# Patient Record
Sex: Female | Born: 2000 | Race: White | Hispanic: No | Marital: Single | Smoking: Never smoker
Health system: Southern US, Community
[De-identification: ages and names within clinical notes are randomized; demographics above are authoritative.]

## PROBLEM LIST (undated history)

## (undated) DIAGNOSIS — Q2546 Tortuous aortic arch: Secondary | ICD-10-CM

## (undated) DIAGNOSIS — Q211 Atrial septal defect, unspecified: Secondary | ICD-10-CM

## (undated) DIAGNOSIS — Q210 Ventricular septal defect: Secondary | ICD-10-CM

## (undated) DIAGNOSIS — G43909 Migraine, unspecified, not intractable, without status migrainosus: Secondary | ICD-10-CM

## (undated) DIAGNOSIS — F419 Anxiety disorder, unspecified: Secondary | ICD-10-CM

## (undated) DIAGNOSIS — F909 Attention-deficit hyperactivity disorder, unspecified type: Secondary | ICD-10-CM

## (undated) HISTORY — PX: CARDIAC SURGERY: SHX584

## (undated) HISTORY — DX: Migraine, unspecified, not intractable, without status migrainosus: G43.909

## (undated) HISTORY — PX: CORONARY STENT PLACEMENT: SHX1402

## (undated) HISTORY — DX: Anxiety disorder, unspecified: F41.9

## (undated) HISTORY — DX: Attention-deficit hyperactivity disorder, unspecified type: F90.9

---
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
## 2019-04-10 ENCOUNTER — Ambulatory Visit: Payer: 59 | Attending: Internal Medicine

## 2019-04-10 DIAGNOSIS — Z23 Encounter for immunization: Secondary | ICD-10-CM

## 2019-04-10 NOTE — Progress Notes (Signed)
   Covid-19 Vaccination Clinic  Name:  Savannah Malone    MRN: [id number] DOB: Jul 11, 2000  04/10/2019  [name] was observed post Covid-19 immunization for 15 minutes without incident. She was provided with Vaccine Information Sheet and instruction to access the V-Safe system.   [name] was instructed to call 911 with any severe reactions post vaccine: Marland Kitchen Difficulty breathing  . Swelling of face and throat  . A fast heartbeat  . A bad rash all over body  . Dizziness and weakness   Immunizations Administered    Name Date Dose VIS Date Route   Pfizer COVID-19 Vaccine 04/10/2019  4:18 PM 0.3 mL 12/30/2018 Intramuscular   Manufacturer: [employer], [employer]   Lot: [id number]   NDC: [address]-9

## 2019-05-01 ENCOUNTER — Ambulatory Visit: Payer: 59 | Attending: Internal Medicine

## 2019-05-01 DIAGNOSIS — Z23 Encounter for immunization: Secondary | ICD-10-CM

## 2019-05-01 NOTE — Progress Notes (Signed)
   Covid-19 Vaccination Clinic  Name:  Sharry Beining    MRN: [id number] DOB: December 22, 2000  05/01/2019  Ms. Leavens was observed post Covid-19 immunization for 15 minutes without incident. She was provided with Vaccine Information Sheet and instruction to access the V-Safe system.   Ms. Gadbois was instructed to call 911 with any severe reactions post vaccine: [place] Kitchen Difficulty breathing  . Swelling of face and throat  . A fast heartbeat  . A bad rash all over body  . Dizziness and weakness   Immunizations Administered    Name Date Dose VIS Date Route   Pfizer COVID-19 Vaccine 05/01/2019  3:24 PM 0.3 mL 12/30/2018 Intramuscular   Manufacturer: [employer], [employer]   Lot: [id number]   NDC: [address]-4

## 2020-01-27 ENCOUNTER — Emergency Department: Payer: 59

## 2020-01-27 ENCOUNTER — Emergency Department
Admission: EM | Admit: 2020-01-27 | Discharge: 2020-01-27 | Disposition: A | Discharge status: Home / Self Care | Payer: 59 | Attending: Emergency Medicine | Admitting: Emergency Medicine

## 2020-01-27 ENCOUNTER — Other Ambulatory Visit: Payer: Self-pay

## 2020-01-27 DIAGNOSIS — R569 Unspecified convulsions: Secondary | ICD-10-CM | POA: Diagnosis not present

## 2020-01-27 DIAGNOSIS — F445 Conversion disorder with seizures or convulsions: Secondary | ICD-10-CM

## 2020-01-27 DIAGNOSIS — E860 Dehydration: Secondary | ICD-10-CM

## 2020-01-27 LAB — URINE DRUG SCREEN, QUALITATIVE (ARMC ONLY)
Amphetamines, Ur Screen: POSITIVE — AB
Barbiturates, Ur Screen: NOT DETECTED
Benzodiazepine, Ur Scrn: POSITIVE — AB
Cannabinoid 50 Ng, Ur ~~LOC~~: NOT DETECTED
Cocaine Metabolite,Ur ~~LOC~~: NOT DETECTED
MDMA (Ecstasy)Ur Screen: NOT DETECTED
Methadone Scn, Ur: NOT DETECTED
Opiate, Ur Screen: NOT DETECTED
Phencyclidine (PCP) Ur S: NOT DETECTED
Tricyclic, Ur Screen: NOT DETECTED

## 2020-01-27 LAB — COMPREHENSIVE METABOLIC PANEL
ALT: 15 U/L (ref 0–44)
AST: 28 U/L (ref 15–41)
Albumin: 4.8 g/dL (ref 3.5–5.0)
Alkaline Phosphatase: 50 U/L (ref 38–126)
Anion gap: 11 (ref 5–15)
BUN: 15 mg/dL (ref 6–20)
CO2: 22 mmol/L (ref 22–32)
Calcium: 9.4 mg/dL (ref 8.9–10.3)
Chloride: 104 mmol/L (ref 98–111)
Creatinine, Ser: 0.6 mg/dL (ref 0.44–1.00)
GFR, Estimated: >60 mL/min (ref >60)
Glucose, Bld: 81 mg/dL (ref 70–99)
Potassium: 3.8 mmol/L (ref 3.5–5.1)
Sodium: 137 mmol/L (ref 135–145)
Total Bilirubin: 1.2 mg/dL (ref 0.3–1.2)
Total Protein: 7.8 g/dL (ref 6.5–8.1)

## 2020-01-27 LAB — CBC WITH DIFFERENTIAL/PLATELET
Abs Immature Granulocytes: 0.04 10*3/uL (ref 0.00–0.07)
Basophils Absolute: 0 10*3/uL (ref 0.0–0.1)
Basophils Relative: 0 %
Eosinophils Absolute: 0 10*3/uL (ref 0.0–0.5)
Eosinophils Relative: 0 %
HCT: 40.3 % (ref 36.0–46.0)
Hemoglobin: 13.9 g/dL (ref 12.0–15.0)
Immature Granulocytes: 0 %
Lymphocytes Relative: 15 %
Lymphs Abs: 1.5 10*3/uL (ref 0.7–4.0)
MCH: 31.3 pg (ref 26.0–34.0)
MCHC: 34.5 g/dL (ref 30.0–36.0)
MCV: 90.8 fL (ref 80.0–100.0)
Monocytes Absolute: 0.3 10*3/uL (ref 0.1–1.0)
Monocytes Relative: 3 %
Neutro Abs: 8.4 10*3/uL — ABNORMAL HIGH (ref 1.7–7.7)
Neutrophils Relative %: 82 %
Platelets: 253 10*3/uL (ref 150–400)
RBC: 4.44 MIL/uL (ref 3.87–5.11)
RDW: 11.9 % (ref 11.5–15.5)
WBC: 10.3 10*3/uL (ref 4.0–10.5)
nRBC: 0 % (ref 0.0–0.2)

## 2020-01-27 LAB — URINALYSIS, COMPLETE (UACMP) WITH MICROSCOPIC
Bacteria, UA: NONE SEEN
Bilirubin Urine: NEGATIVE
Glucose, UA: NEGATIVE mg/dL
Ketones, ur: 20 mg/dL — AB
Leukocytes,Ua: NEGATIVE
Nitrite: NEGATIVE
Protein, ur: 30 mg/dL — AB
Specific Gravity, Urine: 1.028 (ref 1.005–1.030)
pH: 5 (ref 5.0–8.0)

## 2020-01-27 LAB — PREGNANCY, URINE: Preg Test, Ur: NEGATIVE

## 2020-01-27 LAB — ETHANOL: Alcohol, Ethyl (B): <10 mg/dL (ref <10)

## 2020-01-27 MED ORDER — LEVETIRACETAM IN NACL 1000 MG/100ML IV SOLN
1000.0000 mg | Freq: Once | INTRAVENOUS | Status: DC
  Filled 2020-01-27: qty 100

## 2020-01-27 MED ORDER — SODIUM CHLORIDE 0.9 % IV BOLUS: 1000.0000 mL | Freq: Once | INTRAVENOUS | Status: DC

## 2020-01-27 NOTE — ED Notes (Signed)
Spoke with dr. Derrill Kay regarding pt's presenting complaint and triage. Order for labs received, no order for imaging received.

## 2020-01-27 NOTE — ED Provider Notes (Signed)
[facility] Emergency Department Provider Note  ____________________________________________  Time seen: Approximately 7:43 PM  I have reviewed the triage vital signs and the nursing notes.   HISTORY  Chief Complaint Seizures    HPI Savannah Malone is a 20 y.o. female who presents the emergency department with a friend with complaint of repetitive seizures today.  Patient does have a history of epilepsy but does not take daily medications for same.  Patient states that this morning she developed seizure-like activity that was witnessed by the friend.  It was described as tonic-clonic activity.  Patient did have a loss of consciousness but no apparent bowel or bladder incontinence.  Patient was very tired, appeared postictal and went back to bed after seizure-like activity.  Patient slept most of the day, woke up was complaining of a severe headache.  Patient had Motrin which did not alleviate symptoms and patient had another seizure.  Patient was brought to the emergency department and has had minor seizure-like episodes here in the ED.  There is been no recent reported trauma to the head or neck.  Patient denied having a headache prior to the onset of her 1st seizure today.  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  She does not take any medicine following the CVA and does not take again any medicine for epilepsy.  According to the friend, the patient is prone to having seizure-like activity with emotional stress and the last week has been exceptionally stressful for the patient.  No reported URI symptoms of fevers or chills, nasal congestion, sore throat.  No neck pain or stiffness is reported.  No chest pain, shortness of breath, abdominal pain.  No urinary symptoms.  No chance of pregnancy.  Patient endorses alcohol use yesterday but no illicit substance use.         Past Medical History:  Diagnosis Date  . ASD (atrial septal defect)   . Tortuous aortic arch   .  VSD (ventricular septal defect) and coarctation of aorta     There are no problems to display for this patient.   Past Surgical History:  Procedure Laterality Date  . CORONARY STENT PLACEMENT     Aortic Arch    Prior to Admission medications   Not on File    Allergies Patient has no known allergies.  No family history on file.  Social History Social History   Tobacco Use  . Smoking status: Never Smoker  . Smokeless tobacco: Never Used  Substance Use Topics  . Drug use: Not Currently     Review of Systems  Constitutional: No fever/chills Eyes: No visual changes. No discharge ENT: No upper respiratory complaints. Cardiovascular: no chest pain. Respiratory: no cough. No SOB. Gastrointestinal: No abdominal pain.  No nausea, no vomiting.  No diarrhea.  No constipation. Genitourinary: Negative for dysuria. No hematuria Musculoskeletal: Negative for musculoskeletal pain. Skin: Negative for rash, abrasions, lacerations, ecchymosis. Neurological: Positive for multiple seizures today.  History of epilepsy.  Severe headaches, denies focal weakness or numbness.  10 System ROS otherwise negative.  ____________________________________________   PHYSICAL EXAM:  VITAL SIGNS: ED Triage Vitals [01/27/20 1910]  Enc Vitals Group     BP 139/87     Pulse Rate (!) 103     Resp 18     Temp 98 F (36.7 C)     Temp Source Oral     SpO2 100 %     Weight 120 lb (54.4 kg)  Height 5\' 5"  (1.651 m)     Head Circumference      Peak Flow      Pain Score      Pain Loc      Pain Edu?      Excl. in [place]?      Constitutional: Drowsy but very easily awakened with verbal stimuli.  Patient is alert when awake and is oriented.  Generally healthy appearing and in no acute distress. Eyes: Conjunctivae are normal. PERRL. EOMI. Head: Atraumatic.  No apparent signs of trauma. ENT:      Ears:       Nose: No congestion/rhinnorhea.      Mouth/Throat: Mucous membranes are moist.   Oropharynx is nonerythematous and nonedematous.  Uvula is midline. Neck: No stridor.  No cervical spine tenderness to palpation. Hematological/Lymphatic/Immunilogical: No cervical lymphadenopathy.  Cardiovascular: Normal rate, regular rhythm. Normal S1 and S2.  Good peripheral circulation. Respiratory: Normal respiratory effort without tachypnea or retractions. Lungs CTAB. Good air entry to the bases with no decreased or absent breath sounds. Gastrointestinal: Bowel sounds 4 quadrants. Soft and nontender to palpation. No guarding or rigidity. No palpable masses. No distention.  Musculoskeletal: Full range of motion to all extremities. No gross deformities appreciated. Neurologic:  Normal speech and language. No gross focal neurologic deficits are appreciated.  Cranial nerves II through XII grossly intact.  Equal grip strength bilateral upper extremities.  Patient does appear drowsy, slightly postictal. Skin:  Skin is warm, dry and intact. No rash noted. Psychiatric: Mood and affect are normal. Speech and behavior are normal. Patient exhibits appropriate insight and judgement.   ____________________________________________   LABS (all labs ordered are listed, but only abnormal results are displayed)  Labs Reviewed  CBC WITH DIFFERENTIAL/PLATELET - Abnormal; Notable for the following components:      Result Value   Neutro Abs 8.4 (*)    All other components within normal limits  URINE DRUG SCREEN, QUALITATIVE ([facility] ONLY) - Abnormal; Notable for the following components:   Amphetamines, Ur Screen POSITIVE (*)    Benzodiazepine, Ur Scrn POSITIVE (*)    All other components within normal limits  URINALYSIS, COMPLETE (UACMP) WITH MICROSCOPIC - Abnormal; Notable for the following components:   Color, Urine YELLOW (*)    APPearance HAZY (*)    Hgb urine dipstick MODERATE (*)    Ketones, ur 20 (*)    Protein, ur 30 (*)    All other components within normal limits  COMPREHENSIVE METABOLIC  PANEL  PREGNANCY, URINE  ETHANOL   ____________________________________________  EKG   ____________________________________________  RADIOLOGY I personally viewed and evaluated these images as part of my medical decision making, as well as reviewing the written report by the radiologist.  ED Provider Interpretation: No acute intracranial abnormality on CT scan  CT Head Wo Contrast  Result Date: 01/27/2020 CLINICAL DATA:  Headache, intracranial hemorrhage suspected Seizure, nontraumatic (Age 38-40y) Severe HA, HX CVA in 2017. Multiple seizures today history of epilepsy. EXAM: CT HEAD WITHOUT CONTRAST TECHNIQUE: Contiguous axial images were obtained from the base of the skull through the vertex without intravenous contrast. COMPARISON:  None. FINDINGS: Brain: No intracranial hemorrhage, mass effect, or midline shift. No hydrocephalus. The basilar cisterns are patent. No evidence of territorial infarct or acute ischemia. No evidence of prior cortical infarct. No extra-axial or intracranial fluid collection. Vascular: No hyperdense vessel. Skull: Normal. Negative for fracture or focal lesion. Sinuses/Orbits: Paranasal sinuses and mastoid air cells are clear. Incidental mucous retention cyst in the  right maxillary sinus. The visualized orbits are unremarkable. Other: None. IMPRESSION: Negative noncontrast head CT. Electronically Signed   By: Narda Rutherford M.D.   On: 01/27/2020 21:11    ____________________________________________    PROCEDURES  Procedure(s) performed:    Procedures    Medications  levETIRAcetam (KEPPRA) IVPB 1000 mg/100 mL premix (has no administration in time range)  sodium chloride 0.9 % bolus 1,000 mL (has no administration in time range)     ____________________________________________   INITIAL IMPRESSION / ASSESSMENT AND PLAN / ED COURSE  Pertinent labs & imaging results that were available during my care of the patient were reviewed by me and  considered in my medical decision making (see chart for details).  Review of the [place] CSRS was performed in accordance of the [facility] prior to dispensing any controlled drugs.          Patient presented to the emergency department complaining of seizures today.  Patient has a history of epilepsy as well as a previous CVA in 2017.  According to the patient she was largely asymptomatic prior to the onset of seizure this morning.  She then was in bed for most of the day subsequently having a second seizure this evening.  She states that she woke up with a severe headache prior to the second seizure.  Patient does not take any medications for her epilepsy, last reported seizure was in April.  According to the patient, her friend who is with the patient and her family patient has seizures when she has high emotional stress.  Reportedly patient has been under increased stress over the past week.  Patient endorsed alcohol use last night but no illicit substances.  Patient is complaining of headache, weakness and fatigue currently.  On exam patient was neurologically intact.  There was no gross deficits or acute findings.  Patient had a witnessed seizure in the waiting room prior to being brought back to the room.  Given the severe headache, history of CVA CT scan was ordered.  Basic labs, urinalysis and UDS was also obtained.  Patient was to be given Keppra but the patient declines at the advice of her mother who talked to her via telephone [place].  Keppra has not been administered at this time.  While awaiting final results, patient care is transferred to the attending provider Dr. Scotty Court who will be assuming care for final diagnosis and disposition.  Dr. Scotty Court is aware of the patient's history, physical exam and orders to this time.    This chart was dictated using voice recognition software/Dragon. Despite best efforts to proofread, errors can occur which can change the meaning. Any change was  purely unintentional.    Lanette Hampshire 01/27/20 2145    Sharman Cheek, MD 01/27/20 2153

## 2020-01-27 NOTE — ED Triage Notes (Signed)
First Nurse: Pt to ER with friend who states pt is having a seizure.  Pt's eyes are "rolled back" and she is ""not breathing".  Friend asked if pt has hx of seizures, friend reports hx of epilepsy.  This RN performed sternal rub and pt takes a deep breath and sits up in wheelchair and moans.  Pt is not post ictal and responds to RNs voice.  Pt and visitor placed in triage waiting for further evaluation.

## 2020-01-27 NOTE — ED Triage Notes (Signed)
Pt presents with friend who is giving history because pt is not able to at this time. Per friend pt has a history of epilepsy and has had several seizures today. Pt appears postictal resps unlabored. Friend states pt also has been complaining of headache and vomiting. Pt is not able to give history, perrl 25mm.

## 2022-02-20 ENCOUNTER — Ambulatory Visit
Admission: EM | Admit: 2022-02-20 | Discharge: 2022-02-20 | Disposition: A | Discharge status: Home / Self Care | Payer: Commercial Managed Care - PPO

## 2022-02-20 DIAGNOSIS — J0140 Acute pansinusitis, unspecified: Secondary | ICD-10-CM | POA: Diagnosis not present

## 2022-02-20 MED ORDER — ALBUTEROL SULFATE HFA 108 (90 BASE) MCG/ACT IN AERS: 2.0000 | INHALATION_SPRAY | RESPIRATORY_TRACT | 0 refills | Status: DC | PRN

## 2022-02-20 MED ORDER — AMOXICILLIN-POT CLAVULANATE 875-125 MG PO TABS: 1.0000 | ORAL_TABLET | Freq: Two times a day (BID) | ORAL | 0 refills | Status: DC

## 2022-02-20 MED ORDER — IPRATROPIUM BROMIDE 0.03 % NA SOLN: 2.0000 | Freq: Two times a day (BID) | NASAL | 12 refills | Status: DC

## 2022-02-20 NOTE — ED Provider Notes (Signed)
Roderic Palau    CSN: [id number] Arrival date & time: 02/20/22  1415      History   Chief Complaint Chief Complaint  Patient presents with   Cough   Sore Throat    HPI Savannah Malone is a 22 y.o. female.   Patient presents for evaluation of fever, chills, body aches, nasal congestion, rhinorrhea, cough, sore throat and shortness of breath beginning 14 days ago.  Shortness of breath is experienced with exertion, improves at rest.  No known sick contacts.  Decreased appetite tolerating fluids has attempted use of DayQuil and NyQuil which have been minimally effective.  Past Medical History:  Diagnosis Date   ASD (atrial septal defect)    Tortuous aortic arch    VSD (ventricular septal defect) and coarctation of aorta     There are no problems to display for this patient.   Past Surgical History:  Procedure Laterality Date   CORONARY STENT PLACEMENT     Aortic Arch    OB History   No obstetric history on file.      Home Medications    Prior to Admission medications   Medication Sig Start Date End Date Taking? Authorizing Provider  albuterol (VENTOLIN HFA) 108 (90 Base) MCG/ACT inhaler Inhale 2 puffs into the lungs every 4 (four) hours as needed for wheezing or shortness of breath. 02/20/22  Yes Hans Eden, NP  amoxicillin-clavulanate (AUGMENTIN) 875-125 MG tablet Take 1 tablet by mouth every 12 (twelve) hours. 02/20/22  Yes Wealthy Danielski R, NP  amphetamine-dextroamphetamine (ADDERALL) 30 MG tablet Take 30 mg by mouth daily.   Yes [provider]  hydrOXYzine (ATARAX) 25 MG tablet Take 25 mg by mouth 4 (four) times daily. 02/13/22  Yes [provider]  ipratropium (ATROVENT) 0.03 % nasal spray Place 2 sprays into both nostrils every 12 (twelve) hours. 02/20/22  Yes Catarino Vold, Leitha Schuller, NP  propranolol (INDERAL) 10 MG tablet Take 10 mg by mouth 2 (two) times daily. 02/14/22  Yes [provider]  QUEtiapine (SEROQUEL) 50 MG tablet Take  50-100 mg by mouth at bedtime.   Yes [provider]    Family History History reviewed. No pertinent family history.  Social History Social History   Tobacco Use   Smoking status: Never   Smokeless tobacco: Never  Substance Use Topics   Drug use: Not Currently     Allergies   Patient has no known allergies.   Review of Systems Review of Systems Defer to HPI    Physical Exam Triage Vital Signs ED Triage Vitals  Enc Vitals Group     BP 02/20/22 1521 122/86     Pulse Rate 02/20/22 1521 96     Resp 02/20/22 1521 16     Temp 02/20/22 1521 98 F (36.7 C)     Temp Source 02/20/22 1521 Oral     SpO2 02/20/22 1521 98 %     Weight --      Height --      Head Circumference --      Peak Flow --      Pain Score 02/20/22 1522 7     Pain Loc --      Pain Edu? --      Excl. in [place]? --    No data found.  Updated Vital Signs BP 122/86 (BP Location: Right Arm)   Pulse 96   Temp 98 F (36.7 C) (Oral)   Resp 16   LMP 02/17/2022 (Exact Date)  SpO2 98%   Visual Acuity Right Eye Distance:   Left Eye Distance:   Bilateral Distance:    Right Eye Near:   Left Eye Near:    Bilateral Near:     Physical Exam Constitutional:      Appearance: Normal appearance. She is well-developed.  HENT:     Right Ear: Tympanic membrane and ear canal normal.     Left Ear: Tympanic membrane and ear canal normal.     Nose: Congestion and rhinorrhea present.     Right Sinus: Maxillary sinus tenderness and frontal sinus tenderness present.     Left Sinus: Maxillary sinus tenderness and frontal sinus tenderness present.     Mouth/Throat:     Mouth: Mucous membranes are moist.     Pharynx: Posterior oropharyngeal erythema present.     Tonsils: No tonsillar exudate. 0 on the right. 0 on the left.  Eyes:     Extraocular Movements: Extraocular movements intact.  Cardiovascular:     Rate and Rhythm: Normal rate and regular rhythm.     Pulses: Normal pulses.     Heart sounds:  Normal heart sounds.  Pulmonary:     Effort: Pulmonary effort is normal.     Breath sounds: Normal breath sounds.  Musculoskeletal:     Cervical back: Normal range of motion and neck supple.  Skin:    General: Skin is warm and dry.  Neurological:     General: No focal deficit present.     Mental Status: She is alert and oriented to person, place, and time.      UC Treatments / Results  Labs (all labs ordered are listed, but only abnormal results are displayed) Labs Reviewed - No data to display  EKG   Radiology No results found.  Procedures Procedures (including critical care time)  Medications Ordered in UC Medications - No data to display  Initial Impression / Assessment and Plan / UC Course  I have reviewed the triage vital signs and the nursing notes.  Pertinent labs & imaging results that were available during my care of the patient were reviewed by me and considered in my medical decision making (see chart for details).  Acute nonrecurrent pansinusitis  Vital signs are stable and patient is in no signs of distress nontoxic-appearing, low suspicion for pneumonia or bronchitis therefore imaging deferred, presentation is consistent with a sinusitis and therefore we will move forward with bacterial coverage as symptoms have been present for 14 days, prescribed Augmentin as well as ipratropium and albuterol inhaler for management of shortness of breath, may use additional over-the-counter medications as needed for supportive care, may follow-up with his urgent care as needed Final Clinical Impressions(s) / UC Diagnoses   Final diagnoses:  Acute non-recurrent pansinusitis     Discharge Instructions      Treated for sinus infection  Take Augmentin every morning and every evening for 7 days, ideally you will see improvement in about 48 hours and steady progression from there  Begin use of ipratropium nasal spray every morning and every evening to help reduce sinus  congestion and pressure which will help with your ear pain  You may take 2 puffs of inhaler every 4-6 hours as needed to help minimize shortness of breath     You can take Tylenol and/or Ibuprofen as needed for fever reduction and pain relief.   For cough: honey 1/2 to 1 teaspoon (you can dilute the honey in water or another fluid).  You can also  use guaifenesin and dextromethorphan for cough. You can use a humidifier for chest congestion and cough.  If you don't have a humidifier, you can sit in the bathroom with the hot shower running.      For sore throat: try warm salt water gargles, cepacol lozenges, throat spray, warm tea or water with lemon/honey, popsicles or ice, or OTC cold relief medicine for throat discomfort.   For congestion: take a daily anti-histamine like Zyrtec, Claritin, and a oral decongestant, such as pseudoephedrine.  You can also use Flonase 1-2 sprays in each nostril daily.   It is important to stay hydrated: drink plenty of fluids (water, gatorade/powerade/pedialyte, juices, or teas) to keep your throat moisturized and help further relieve irritation/discomfort.    ED Prescriptions     Medication Sig Dispense Auth. Provider   amoxicillin-clavulanate (AUGMENTIN) 875-125 MG tablet Take 1 tablet by mouth every 12 (twelve) hours. 14 tablet Rumaldo Difatta R, NP   ipratropium (ATROVENT) 0.03 % nasal spray Place 2 sprays into both nostrils every 12 (twelve) hours. 30 mL [place] Inthavong R, NP   albuterol (VENTOLIN HFA) 108 (90 Base) MCG/ACT inhaler Inhale 2 puffs into the lungs every 4 (four) hours as needed for wheezing or shortness of breath. 8 g Hans Eden, NP      PDMP not reviewed this encounter.   Hans Eden, NP 02/20/22 1553

## 2022-02-20 NOTE — Discharge Instructions (Signed)
Treated for sinus infection  Take Augmentin every morning and every evening for 7 days, ideally you will see improvement in about 48 hours and steady progression from there  Begin use of ipratropium nasal spray every morning and every evening to help reduce sinus congestion and pressure which will help with your ear pain  You may take 2 puffs of inhaler every 4-6 hours as needed to help minimize shortness of breath     You can take Tylenol and/or Ibuprofen as needed for fever reduction and pain relief.   For cough: honey 1/2 to 1 teaspoon (you can dilute the honey in water or another fluid).  You can also use guaifenesin and dextromethorphan for cough. You can use a humidifier for chest congestion and cough.  If you don't have a humidifier, you can sit in the bathroom with the hot shower running.      For sore throat: try warm salt water gargles, cepacol lozenges, throat spray, warm tea or water with lemon/honey, popsicles or ice, or OTC cold relief medicine for throat discomfort.   For congestion: take a daily anti-histamine like Zyrtec, Claritin, and a oral decongestant, such as pseudoephedrine.  You can also use Flonase 1-2 sprays in each nostril daily.   It is important to stay hydrated: drink plenty of fluids (water, gatorade/powerade/pedialyte, juices, or teas) to keep your throat moisturized and help further relieve irritation/discomfort.

## 2022-02-20 NOTE — ED Triage Notes (Signed)
Patient states she has sore throat, ear pain, headache, cough, congestion for 2 weeks. Taking Dyquil, Nyquil, sudafed.

## 2022-03-30 ENCOUNTER — Encounter: Payer: Self-pay | Admitting: Medical

## 2022-03-30 ENCOUNTER — Other Ambulatory Visit: Payer: Self-pay

## 2022-03-30 ENCOUNTER — Ambulatory Visit (INDEPENDENT_AMBULATORY_CARE_PROVIDER_SITE_OTHER): Payer: 59 | Admitting: Medical

## 2022-03-30 VITALS — BP 115/65 | HR 91 | Temp 98.2°F | Ht 64.96 in | Wt 154.0 lb

## 2022-03-30 DIAGNOSIS — D225 Melanocytic nevi of trunk: Secondary | ICD-10-CM

## 2022-03-30 DIAGNOSIS — R21 Rash and other nonspecific skin eruption: Secondary | ICD-10-CM | POA: Diagnosis not present

## 2022-03-30 NOTE — Progress Notes (Signed)
[address] [place], [place] [address] Phone: [phone] Fax: [phone]   Office Visit Note  Patient Name: Savannah Malone  Date of [id number]  Med Rec number [id number]  Date of Service: 03/30/2022  Allergies: Patient has no known allergies.  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  Noted rash to anterior right shoulder 2 months ago, raised and mildly red, small bumps. Has noted it getting larger in past week. Also notes a "new mole" in axilla. Denies any associated itching or tenderness. Used some Cerave/Cetaphil moisturizer. Hx of eczema to arms but not for years. Did spray tan a few days ago which has altered appearance of rash.  Has never seen dermatologist. Does not have a lot of moles.   Had laryngitis, bronchitis and sinus infection in Jan and early Feb. Treated with Augmentin in early Feb.    Current Medication:  Outpatient Encounter Medications as of 03/30/2022  Medication Sig   clonazePAM (KLONOPIN) 1 MG tablet TAKE 1/2 TO 1 TABLET BY MOUTH DAILY AS NEEDED FOR EXTREME ANXIETY ONLY 30 DAYS   amphetamine-dextroamphetamine (ADDERALL) 30 MG tablet Take 30 mg by mouth daily.   hydrOXYzine (ATARAX) 25 MG tablet Take 25 mg by mouth 4 (four) times daily.   levonorgestrel (MIRENA) 20 MCG/DAY IUD by Intrauterine route.   propranolol (INDERAL) 10 MG tablet Take 10 mg by mouth 2 (two) times daily.   QUEtiapine (SEROQUEL) 50 MG tablet Take 50-100 mg by mouth at bedtime.   [DISCONTINUED] albuterol (VENTOLIN HFA) 108 (90 Base) MCG/ACT inhaler Inhale 2 puffs into the lungs every 4 (four) hours as needed for wheezing or shortness of breath.   [DISCONTINUED] amoxicillin-clavulanate (AUGMENTIN) 875-125 MG tablet Take 1 tablet by mouth every 12 (twelve) hours. (Patient not taking: Reported on 03/30/2022)   [DISCONTINUED] ipratropium (ATROVENT) 0.03 % nasal spray Place 2 sprays into both nostrils  every 12 (twelve) hours.   No facility-administered encounter medications on file as of 03/30/2022.      Medical History: Past Medical History:  Diagnosis Date   ASD (atrial septal defect)    Tortuous aortic arch    VSD (ventricular septal defect) and coarctation of aorta      Vital Signs: BP 115/65   Pulse 91   Temp 98.2 F (36.8 C) (Tympanic)   Ht 5' 4.96" (1.65 m)   Wt 154 lb (69.9 kg)   SpO2 98%   BMI 25.66 kg/m    Review of Systems  Constitutional:  Negative for chills and fever.  Skin:  Positive for rash.       New mole    Physical Exam Vitals reviewed.  Constitutional:      General: She is not in acute distress.    Appearance: She is not ill-appearing.  Skin:    Comments: Skin with light brown tan consistent with spray tan, which limits exam somewhat. Cluster of very slightly raised well-demarcated brown papules (2-3 mm diameter) to right anterior shoulder. Plaque of hypopigmented skin, well demarcated adjacent to this. Small dark brown papule to inferior right axilla ~2-3 mm diameter, round with regular border, grossly symmetric with consistent color.  Neurological:     Mental Status: She is alert.     Assessment/Plan: 1. Rash and nonspecific skin eruption Possible fungal infection, though exam limited by overlying spray tan. Recommended trial of OTC topical antifungal cream (i.e. Lamisil or Lotrimin) twice daily for next 2  weeks.   2. Nevus of axillary region Grossly benign-appearing mole, but did advise scheduling appointment with dermatologist at earliest convenience for full body skin exam and assessment of mole, especially if mole is changing/evolving.     General Counseling: [place] verbalizes understanding of the findings of todays visit and agrees with plan of treatment. she has been encouraged to call the office with any questions or concerns that should arise related to todays visit.   Time spent:15 [place] PA-C [place] 03/30/2022 2:24 PM

## 2022-04-03 NOTE — Patient Instructions (Addendum)
-  Try over-the-counter Lamisil or Lotrimin cream for rash twice a day for 2 weeks. -Schedule an appointment with dermatology soon for skin exam and evaluation of mole.

## 2022-06-07 IMAGING — CT CT HEAD W/O CM
3 series · 16 of 47 positions shown, 19 images · non-contrast
Comparison: None.

CLINICAL DATA: Headache, intracranial hemorrhage suspected Seizure,
nontraumatic (Age 18-40y) Severe HA, HX CVA in 3711. Multiple
seizures today history of epilepsy.

EXAM:
CT HEAD WITHOUT CONTRAST
TECHNIQUE: Contiguous axial images were obtained from the base of the skull
through the vertex without intravenous contrast.

[Series 3: head wo · axial · 0.42mm/px · z∈[-124,+1]mm · 10 of 30 slices shown, 13 images]
[im 3/30  brain]
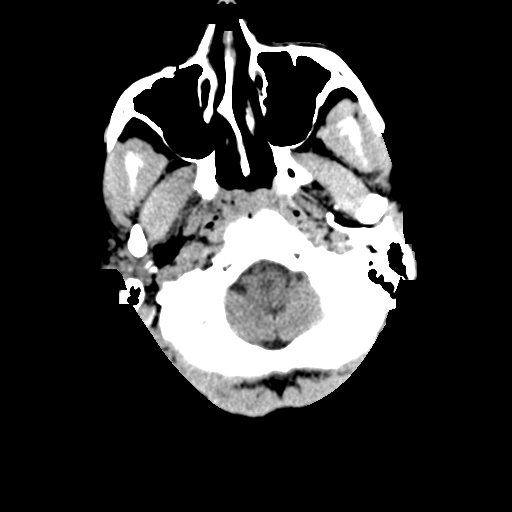
[im 3/30  bone]
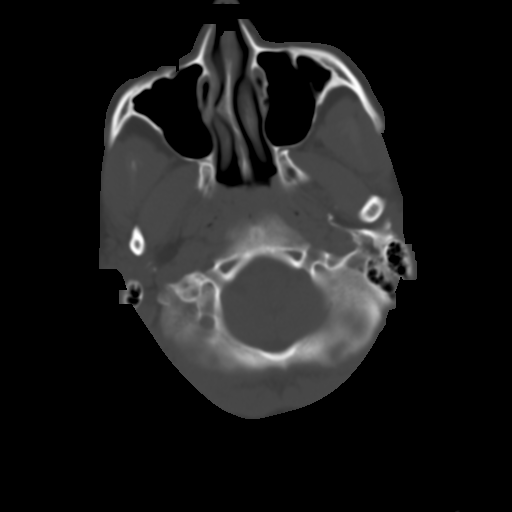
[im 6/30  brain]
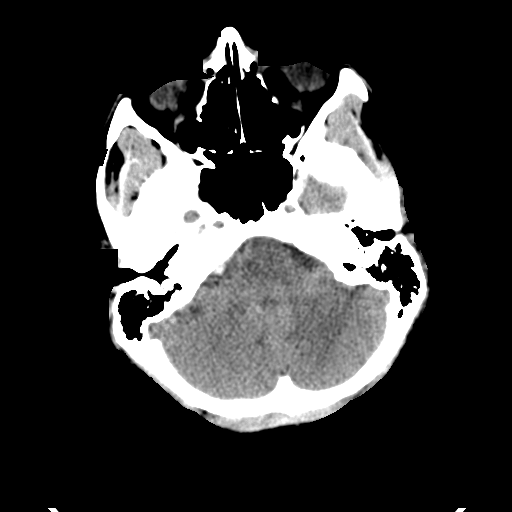
[im 9/30  brain]
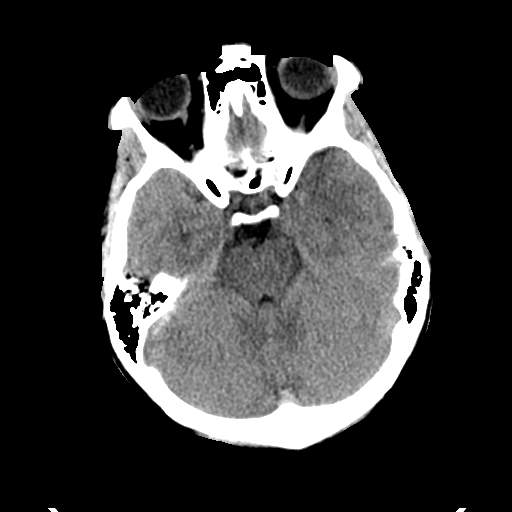
[im 11/30  brain]
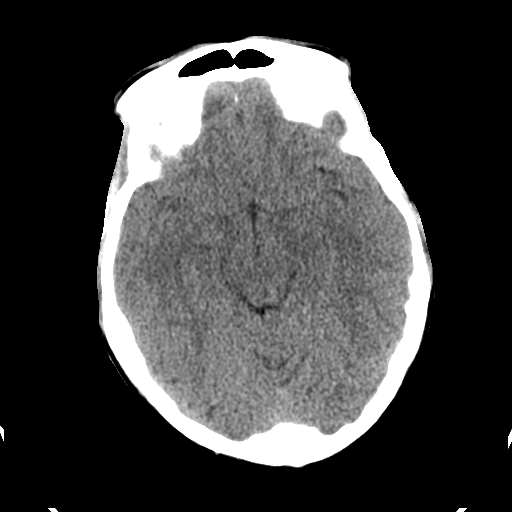
[im 14/30  brain]
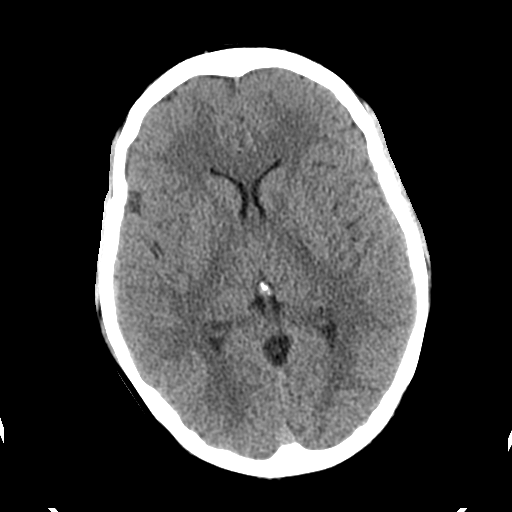
[im 14/30  bone]
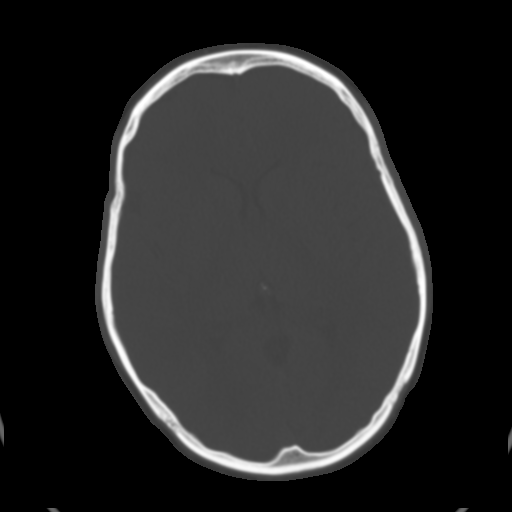
[im 17/30  brain]
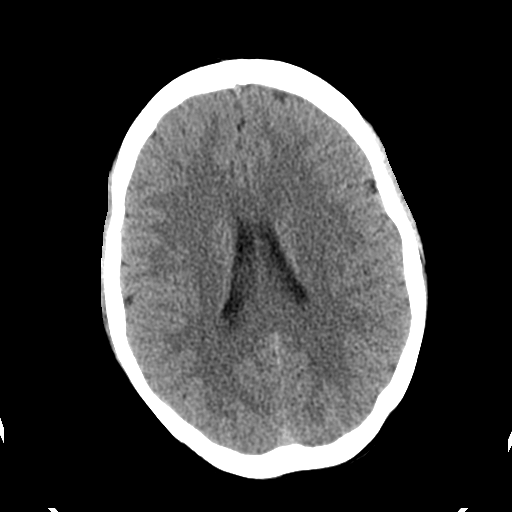
[im 20/30  brain]
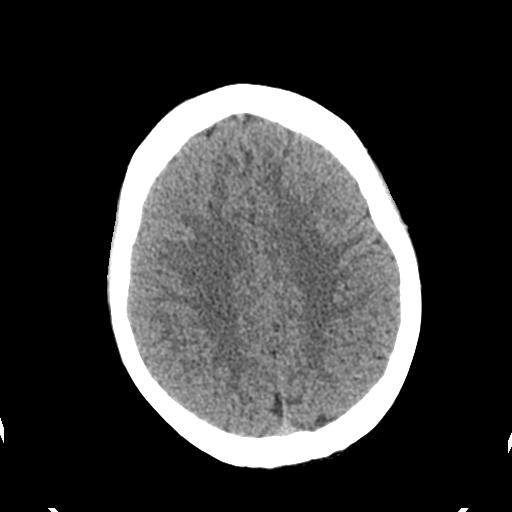
[im 23/30  brain]
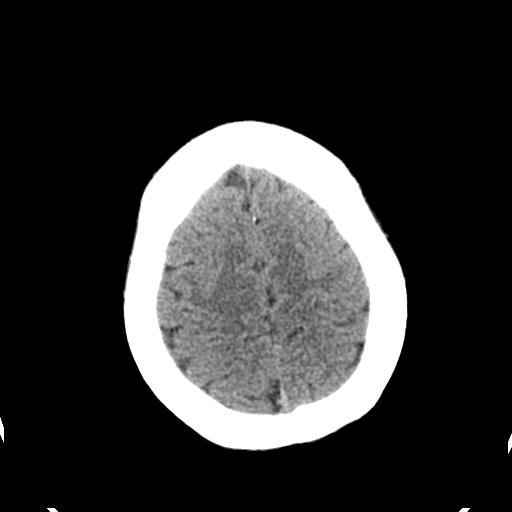
[im 25/30  brain]
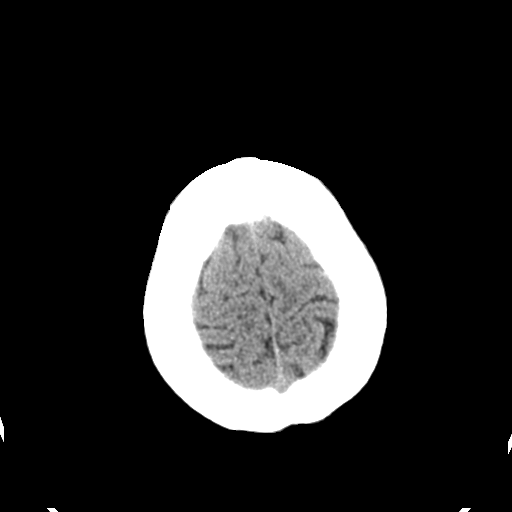
[im 25/30  bone]
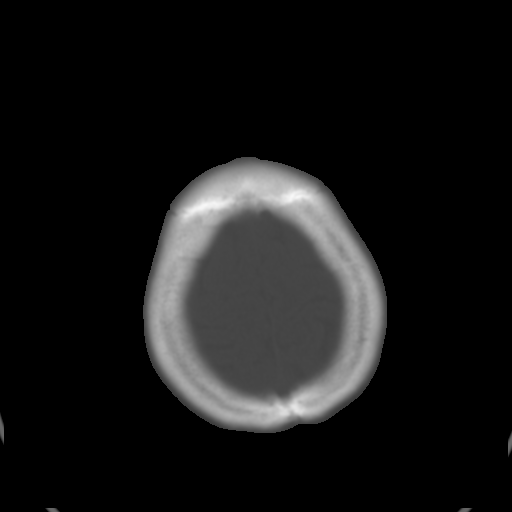
[im 28/30  brain]
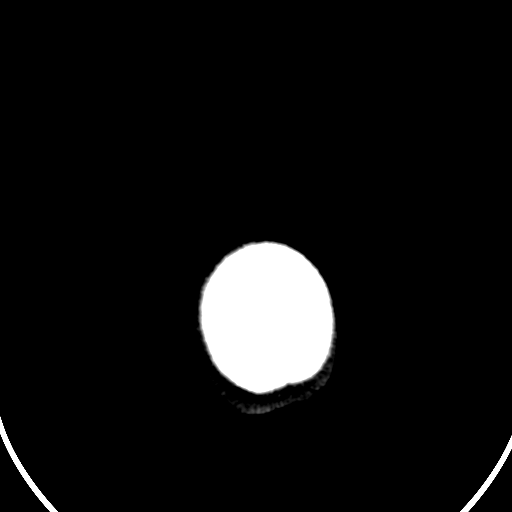

[Series 4: coronal soft tissue · coronal · 0.28mm/px · 3 of 62 slices shown]
[im 22/62  brain]
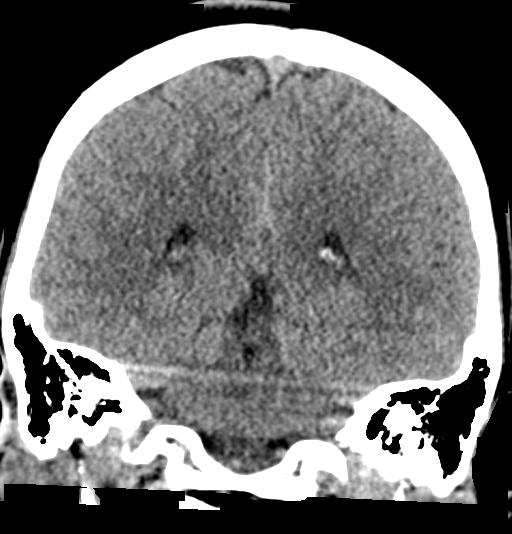
[im 28/62  brain]
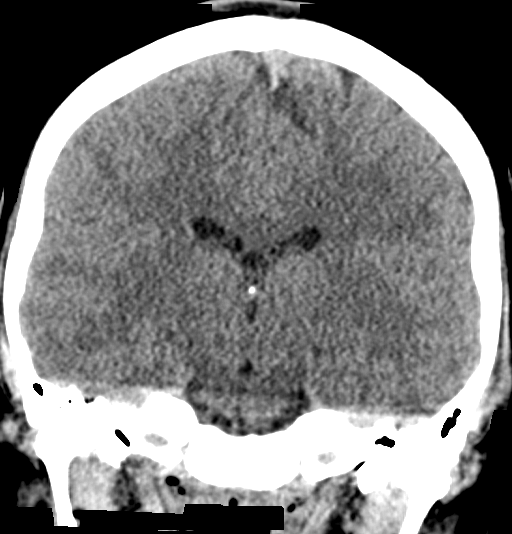
[im 34/62  brain]
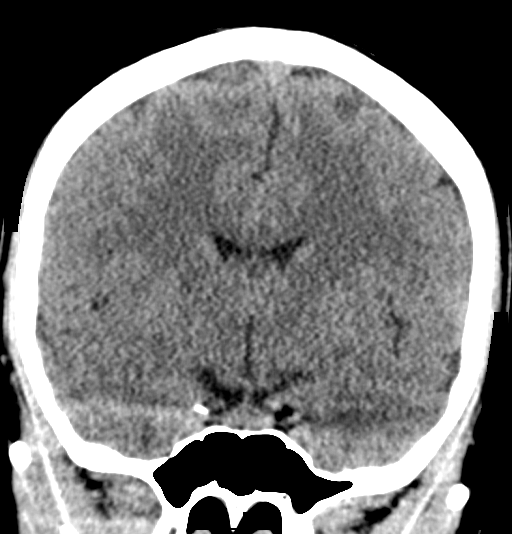

[Series 5: sagittal soft tissue · sagittal · 0.29mm/px · 3 of 48 slices shown]
[im 16/48  brain]
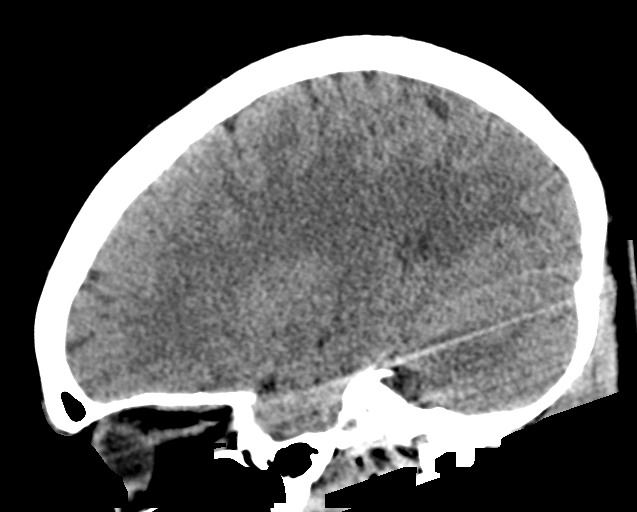
[im 24/48  brain]
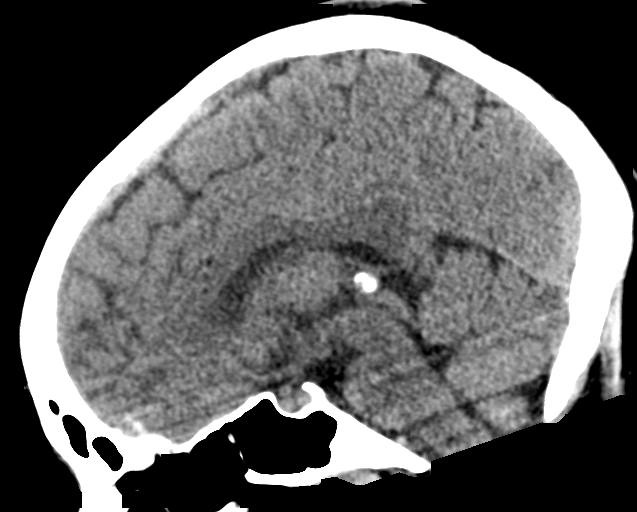
[im 32/48  brain]
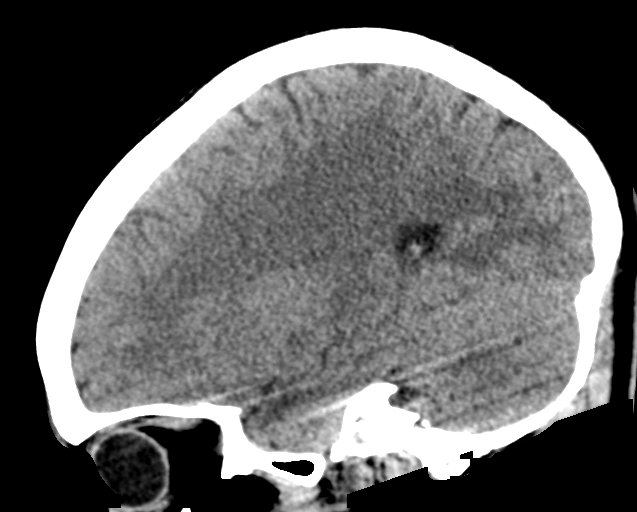

[16 of 47 positions shown; findings below may reference images not displayed]

FINDINGS: Brain: No intracranial hemorrhage, mass effect, or midline shift. No
hydrocephalus. The basilar cisterns are patent. No evidence of
territorial infarct or acute ischemia. No evidence of prior cortical
infarct. No extra-axial or intracranial fluid collection.

Vascular: No hyperdense vessel.

Skull: Normal. Negative for fracture or focal lesion.

Sinuses/Orbits: Paranasal sinuses and mastoid air cells are clear.
Incidental mucous retention cyst in the right maxillary sinus. The
visualized orbits are unremarkable.

Other: None.
IMPRESSION: Negative noncontrast head CT.
